# Patient Record
Sex: Female | Born: 1955 | Race: White | Hispanic: No | Marital: Single | Smoking: Former smoker
Health system: Southern US, Community
[De-identification: ages and names within clinical notes are randomized; demographics above are authoritative.]

## PROBLEM LIST (undated history)

## (undated) HISTORY — PX: AUGMENTATION MAMMAPLASTY: SUR837

---

## 1999-12-27 ENCOUNTER — Other Ambulatory Visit: Admission: RE | Admit: 1999-12-27 | Discharge: 1999-12-27 | Payer: Self-pay | Admitting: *Deleted

## 1999-12-28 ENCOUNTER — Encounter: Payer: Self-pay | Admitting: *Deleted

## 1999-12-28 ENCOUNTER — Encounter: Admission: RE | Admit: 1999-12-28 | Discharge: 1999-12-28 | Payer: Self-pay | Admitting: *Deleted

## 2000-07-13 ENCOUNTER — Encounter: Payer: Self-pay | Admitting: Family Medicine

## 2000-07-13 ENCOUNTER — Ambulatory Visit (HOSPITAL_COMMUNITY): Admission: RE | Admit: 2000-07-13 | Discharge: 2000-07-13 | Payer: Self-pay | Admitting: Family Medicine

## 2004-04-23 ENCOUNTER — Inpatient Hospital Stay (HOSPITAL_COMMUNITY): Admission: EM | Admit: 2004-04-23 | Discharge: 2004-04-25 | Payer: Self-pay | Admitting: Emergency Medicine

## 2005-04-21 ENCOUNTER — Emergency Department (HOSPITAL_COMMUNITY): Admission: EM | Admit: 2005-04-21 | Discharge: 2005-04-21 | Payer: Self-pay | Admitting: Emergency Medicine

## 2006-04-19 ENCOUNTER — Ambulatory Visit (HOSPITAL_COMMUNITY): Admission: RE | Admit: 2006-04-19 | Discharge: 2006-04-19 | Payer: Self-pay | Admitting: Family Medicine

## 2009-01-26 ENCOUNTER — Emergency Department (HOSPITAL_COMMUNITY): Admission: EM | Admit: 2009-01-26 | Discharge: 2009-01-26 | Payer: Self-pay | Admitting: Emergency Medicine

## 2011-01-28 NOTE — H&P (Signed)
NAME:  Katelyn Beck, Katelyn Beck                      ACCOUNT NO.:  [id number]   MEDICAL RECORD NO.:  [id number]                   PATIENT TYPE:  EMS   LOCATION:  MAJO                                 FACILITY:  [facility]   PHYSICIAN:  Isla Pence, M.D.             DATE OF BIRTH:  10/22/1955   DATE OF ADMISSION:  04/23/2004  DATE OF DISCHARGE:                                HISTORY & PHYSICAL   IDENTIFICATION:  This is a 55 year old white female whose primary care  physician is Dr. Elana Alm. Reade with [facility].   CHIEF COMPLAINT:  Weakness/fever/chills since Monday night.   HISTORY OF PRESENT ILLNESS:  This patient says that she stopped her Vicodin  and Percocet abuse on Sunday just cold [place].  After that, she started  developing fever, chills and weakness, and goose bumps.  The fever was not  documented, but she just felt hot to touch.  She had taken ibuprofen one  night and all other times had been taking Tylenol.  She would have episodes  of a fever and then have the sense of achiness.  She denies any IV drug use  or any other drugs, aside from the Vicodin and Percocet.  She would take  anywhere a total of 5-8 of either the Percocet or Vicodin combination and  has been doing so over the past 1-2 years.  The patient was concerned that  maybe she was experiencing withdrawal symptoms.  She was originally given  the Vicodin or Percocet for teeth pain, but since then has been buying it  off the street.  In addition, she has also noted some urinary symptoms  consisting of trouble starting her flow and stopping her flow over the past  month, however, she had frequency and dysuria over the past week.  She also  had nausea and vomiting which were mild, and with emesis x1.  No previous  history of UTI.  In the emergency room at [facility], she was noted to be  hypotensive with initial blood pressure of 88/48.  She was given a normal  saline fluid bolus and started on regular IV fluid.  She was  also noted to  have a UTI.  It was felt by the ER physician that patient had urosepsis and  needed to be admitted.  Negative for back pain.   ALLERGIES:  No known drug allergies.   CURRENT MEDICATIONS:  None.   PAST MEDICAL HISTORY:  History of migraine headaches over the past 3-1/2  years, but over the past 4 or 5 months, she has been having 1 migraine  headache a month, but not more frequently than that.  She has also not had  any menstruation for the past year, but has not sought medical attention for  this.  She had a bite to the left periorbital area about a month ago for  which she was treated as an outpatient.  Otherwise, she denies any  diabetes  mellitus, hypertension, hyperlipidemia, MI or any other underlying medical  problems.  She has not had a Pap for more than a year.  She thinks her last  Pap was done at [facility].   PAST SURGICAL HISTORY:  1. She is status post right salpingo-oophorectomy for a cyst at the age of     11.  2. She is also status post tonsillectomy.  3. No other surgery.   SOCIAL HISTORY:  She has been married since October of 2004 to the current  partner, but she has been together with this partner for the past 18 years.  She has got 2 biological kids from a previous relationship and 2 step-kids  with her current relationship.  Tobacco:  She smokes about a pack and a half  per day for the past 15-20 years.  She used to drink when she was growing up  in her high school days but none since then, used to drink during the  weekend during her high school days.   FAMILY HISTORY:  Family history is positive for hypertension in the brother,  stroke in the father at the age of 31, who died from complications of a  stroke with pneumonia.  There is no breast, colon, prostate or ovarian  cancer.  There is skin cancer in the grandfather.   REVIEW OF SYSTEMS:  Review of systems is as per HPI.  She otherwise denies  chronic cough except for q.a.m.,  denies hemoptysis, has noted weight loss of  about 20 pounds in the same time period that she has been abusing the  Vicodin and Percocet.  She otherwise denies heartburn, melena or  hematochezia, although she did report 1 episode of melena with these current  symptoms.  She otherwise denies any back pain.   PHYSICAL EXAM:  VITAL SIGNS:  As mentioned earlier, her initial blood  pressure was 88/48, pulse of 90, respiratory rate 18, saturations are 99% on  room air.  Repeat vitals done 23 minutes past midnight show a temperature of  100.6, blood pressure is 111/88, pulse of 90, respiratory rate 14.  She did  have orthostatic blood pressures done and that shows lying, her blood  pressure is 116/69 and standing is 111/88; pulse, there was no significant  difference, it went from 85 to 90, but this was after she had already  received IV fluids.  GENERAL:  In general, she does not appear to be in any distress.  She is  able to give me a good history.  She comes across as a little nervous.  HEENT:  HEENT shows dry buccal mucosa and tongue, otherwise, conjunctivae  are normal.  NECK:  There is no adenopathy.  LUNGS:  Her lungs are clear to auscultation bilaterally without any crackles  or wheezes.  HEART:  Heart is regular rate and rhythm.  ABDOMEN:  Bowel sounds are normal, soft, nontender.  No organomegaly.  BACK:  There is no CV angle tenderness, no spinous tenderness.  EXTREMITIES:  Lower extremities:  There is no pretibial edema.  NEUROLOGIC:  Aside from being a little jittery, there is no gross  neurological deficit.   LABORATORY WORKUP:  EKG shows normal sinus rhythm.   No radiological studies were done.   Her white count shows a WBC of 8700, hemoglobin and hematocrit of 12.6 and  36.2, platelet count 221,000; differential shows 82% neutrophils,  lymphocytes of 11.  Sodium is 135, potassium 3.1, chloride 105, CO2 23, glucose 103, BUN  and creatinine 11 and 1.1.  Her LFTs are entirely  normal  with the exception of albumin of 2.8.  Her urine drug screen was negative  except for benzodiazepine, although she did not tell me about this and she  states she was not on any medications. UA shows specific gravity of 1.013,  pH of 6, large hemoglobin, negative for protein, positive for nitrite and  leukocytes; urine microscopy shows 7-10 wbc's, rbc's of 11-20 and many  bacteria.   ASSESSMENT AND PLAN:  1. In regards to her fever, chills and generalized achiness, it could     certainly be part of a viral syndrome or actually could be part of     withdrawal symptoms from her narcotics, although she is really 5 days     post her last narcotic dosing, so it may not be related to that at all;     as to whether she might have an infectious process going on, certainly it     is a possibility.  There is no suggestion of pyelonephritis.  I am not     strongly convinced of urosepsis because her total white count is not     elevated, although she does have a left shift, although with her low     blood pressure, one could make a case for it, but I think some of it may     be related to just her being dehydrated.  We will go ahead and bring her     in, continue her with intravenous fluids.  We will make Ativan available     for her for her periods of anxiety.  I am not going to put her on     methadone, since she has actually been off narcotics for the past 5 days.     [facility] is already seeing her and will set her up for     outpatient rehabilitation program.  2. From an infectious disease standpoint, she certainly has a urinary tract     infection.  She does have a left shift on the white count, although total     white count is not elevated.  We will continue her on the Cipro; she had     received 1 dose of Cipro in the emergency room.  We will obtain urine     culture.  Certainly, it is possible that she could be having slight     urosepsis, although she is actually ambulating  now.  3. In regards to hypotension, this may be related just to being dehydrated.  4. In regards to menopausal symptoms, we will check an [facility] and [facility].  She will     need a Pap through her primary care physician.  5. In regards to her low potassium, she was given potassium in the emergency     room.  We will go ahead and give her another 40 mEq x1.  6. In regards to her slightly decreased albumin, it certainly reflects the     decreased appetite that she has had as an outpatient secondary to her     narcotic abuse and hopefully this will improve, once she starts improving     on her appetite again.  Isla Pence, M.D.    RRV/MEDQ  D:  04/23/2004  T:  04/23/2004  Job:  [id number]   cc:   Molly Maduro A. Nicholos Johns, M.D.  [address]  [place]  [place] [address]  Fax: [phone]

## 2011-01-28 NOTE — Discharge Summary (Signed)
NAME:  Katelyn Beck, Katelyn Beck                      ACCOUNT NO.:  [id number]   MEDICAL RECORD NO.:  [id number]                   PATIENT TYPE:  INP   LOCATION:  5524                                 FACILITY:  [facility]   PHYSICIAN:  Sherin Quarry, MD                   DATE OF BIRTH:  [birth year]   DATE OF ADMISSION:  04/22/2004  DATE OF DISCHARGE:  04/25/2004                                 DISCHARGE SUMMARY   HISTORY OF PRESENT ILLNESS:  Katelyn Beck is a 55 year old lady who  indicated on April 23, 2004, at the time of her admission, that she had a  longstanding history of abusing of Vicodin and Percocet.  About five days  prior to admission, she recognized that she was having a major problem with  narcotic abuse and decided to stop taking these medications.  After doing  so, she became somewhat tremulous, tachycardic, and diaphoretic.  She  attributed to these symptoms to narcotic withdrawal but then she developed  associated with chills.   At that point, she presented to the emergency room where evidence of urinary  tract infection was noted.  It was therefore felt prudent to admit her to  the hospital.  She was initially seen by Dr. Rita Ohara in the emergency  room.   PHYSICAL EXAMINATION:  HEENT:  Within normal limits.  CHEST:  Clear to auscultation and percussion.  CARDIOVASCULAR:  Normal S1and S2 without rubs, murmurs, or gallops.  ABDOMEN:  Benign.  There were no masses or tenderness.  NEUROLOGICAL:  Normal.  EXTREMITIES:  Normal.   LABORATORY DATA:  The initial white count was 8700, hemoglobin 12.6.  Sodium  was 135, potassium 3.1, creatinine was 1.1, BUN 11, albumin 2.8.  Urine drug  screen was positive for benzodiazepines.  Urinalysis was positive for  nitrite, leukocyte esterase, and hemoglobin.  There were many white cells  and bacteria seen.  Subsequently, a urine culture grew greater than 100,000  colonies of gram-negative rods that has not been further speciated  at this  point.  CBC revealed a white count of 8700, hemoglobin 12.6, hematocrit  36.2.  Liver profile was normal.   Chest x-ray showed evidence of bronchitis.  EKG was within normal limits.   HOSPITAL COURSE:  On the first day, the patient spiked a temperature to 103  degrees.  She was given normal saline at 200 per hour.  She was also placed  on Cipro 400 mg IV every 12 hours.  She felt considerably better over the  next 48 hours.  She was ambulatory.  She was able to eat.  She was not  experiencing any back pain or dysuria.  During the course of her  hospitalization, really the main complaint that she had was symptoms of acid  reflux.   By April 25, 2004, the patient seemed to be doing well and I felt it was  reasonable to discharge her.  At that time, I emphasized to her the  importance of compliance with her antibiotic regimen.  I advised her to  follow up with Dr. Elana Alm. Reade in seven to ten days and follow up with a  urinalysis.   DISCHARGE DIAGNOSES:  1. Urinary tract infection, probably pyelonephritis.  2. Dehydration.  3. Acid reflux.  4. Chronic abuse of narcotics.  5. Chronic migraine headaches.   DISCHARGE MEDICATIONS:  1. Cipro 500 mg b.i.d. for eight more days.  2. Protonix 40 mg q.d.   CONDITION ON DISCHARGE:  Good.                                                Sherin Quarry, MD    SY/MEDQ  D:  04/25/2004  T:  04/25/2004  Job:  [id number]   cc:   Molly Maduro A. Nicholos Johns, M.D.  [address]  [place]  [place] [address]  Fax: [phone]

## 2014-12-09 ENCOUNTER — Encounter (HOSPITAL_COMMUNITY): Payer: Self-pay | Admitting: *Deleted

## 2014-12-09 ENCOUNTER — Emergency Department (HOSPITAL_COMMUNITY): Payer: No Typology Code available for payment source

## 2014-12-09 ENCOUNTER — Other Ambulatory Visit: Payer: Self-pay

## 2014-12-09 ENCOUNTER — Emergency Department (HOSPITAL_COMMUNITY)
Admission: EM | Admit: 2014-12-09 | Discharge: 2014-12-09 | Disposition: A | Discharge status: Home / Self Care | Payer: No Typology Code available for payment source | Attending: Emergency Medicine | Admitting: Emergency Medicine

## 2014-12-09 DIAGNOSIS — Z3202 Encounter for pregnancy test, result negative: Secondary | ICD-10-CM | POA: Diagnosis not present

## 2014-12-09 DIAGNOSIS — Z79899 Other long term (current) drug therapy: Secondary | ICD-10-CM | POA: Diagnosis not present

## 2014-12-09 DIAGNOSIS — K279 Peptic ulcer, site unspecified, unspecified as acute or chronic, without hemorrhage or perforation: Secondary | ICD-10-CM | POA: Diagnosis not present

## 2014-12-09 DIAGNOSIS — Z87891 Personal history of nicotine dependence: Secondary | ICD-10-CM | POA: Diagnosis not present

## 2014-12-09 DIAGNOSIS — Z793 Long term (current) use of hormonal contraceptives: Secondary | ICD-10-CM | POA: Diagnosis not present

## 2014-12-09 DIAGNOSIS — R5383 Other fatigue: Secondary | ICD-10-CM | POA: Insufficient documentation

## 2014-12-09 DIAGNOSIS — K921 Melena: Secondary | ICD-10-CM | POA: Diagnosis present

## 2014-12-09 LAB — URINE MICROSCOPIC-ADD ON

## 2014-12-09 LAB — URINALYSIS, ROUTINE W REFLEX MICROSCOPIC
Bilirubin Urine: NEGATIVE
Glucose, UA: NEGATIVE mg/dL
Ketones, ur: NEGATIVE mg/dL
LEUKOCYTES UA: NEGATIVE
Nitrite: NEGATIVE
PH: 7 (ref 5.0–8.0)
Protein, ur: NEGATIVE mg/dL
Specific Gravity, Urine: 1.004 — ABNORMAL LOW (ref 1.005–1.030)
UROBILINOGEN UA: 0.2 mg/dL (ref 0.0–1.0)

## 2014-12-09 LAB — COMPREHENSIVE METABOLIC PANEL
ALT: 11 U/L (ref 0–35)
ANION GAP: 9 (ref 5–15)
AST: 18 U/L (ref 0–37)
Albumin: 4.6 g/dL (ref 3.5–5.2)
Alkaline Phosphatase: 62 U/L (ref 39–117)
BUN: 10 mg/dL (ref 6–23)
CO2: 26 mmol/L (ref 19–32)
Calcium: 9.1 mg/dL (ref 8.4–10.5)
Chloride: 107 mmol/L (ref 96–112)
Creatinine, Ser: 0.87 mg/dL (ref 0.50–1.10)
GFR calc Af Amer: 83 mL/min — ABNORMAL LOW (ref >90)
GFR calc non Af Amer: 72 mL/min — ABNORMAL LOW (ref >90)
GLUCOSE: 100 mg/dL — AB (ref 70–99)
Potassium: 3.3 mmol/L — ABNORMAL LOW (ref 3.5–5.1)
Sodium: 142 mmol/L (ref 135–145)
Total Bilirubin: 0.9 mg/dL (ref 0.3–1.2)
Total Protein: 7.6 g/dL (ref 6.0–8.3)

## 2014-12-09 LAB — CBC
HEMATOCRIT: 39.6 % (ref 36.0–46.0)
Hemoglobin: 13.5 g/dL (ref 12.0–15.0)
MCH: 29.1 pg (ref 26.0–34.0)
MCHC: 34.1 g/dL (ref 30.0–36.0)
MCV: 85.3 fL (ref 78.0–100.0)
PLATELETS: 354 10*3/uL (ref 150–400)
RBC: 4.64 MIL/uL (ref 3.87–5.11)
RDW: 14.3 % (ref 11.5–15.5)
WBC: 8.8 10*3/uL (ref 4.0–10.5)

## 2014-12-09 LAB — LIPASE, BLOOD: Lipase: 21 U/L (ref 11–59)

## 2014-12-09 LAB — POC OCCULT BLOOD, ED: FECAL OCCULT BLD: NEGATIVE

## 2014-12-09 LAB — POC URINE PREG, ED: Preg Test, Ur: NEGATIVE

## 2014-12-09 MED ORDER — ONDANSETRON HCL 4 MG/2ML IJ SOLN
4.0000 mg | Freq: Once | INTRAMUSCULAR | Status: AC
  Administered 2014-12-09: 4 mg via INTRAVENOUS
  Filled 2014-12-09: qty 2

## 2014-12-09 MED ORDER — FAMOTIDINE IN NACL 20-0.9 MG/50ML-% IV SOLN
20.0000 mg | INTRAVENOUS | Status: AC
  Administered 2014-12-09: 20 mg via INTRAVENOUS
  Filled 2014-12-09: qty 50

## 2014-12-09 MED ORDER — SODIUM CHLORIDE 0.9 % IV BOLUS (SEPSIS)
1000.0000 mL | Freq: Once | INTRAVENOUS | Status: AC
  Administered 2014-12-09: 1000 mL via INTRAVENOUS

## 2014-12-09 MED ORDER — FAMOTIDINE 20 MG PO TABS
20.0000 mg | ORAL_TABLET | Freq: Two times a day (BID) | ORAL | Status: AC
Start: 2014-12-09 — End: ?

## 2014-12-09 MED ORDER — SUCRALFATE 1 GM/10ML PO SUSP: 1.0000 g | Freq: Three times a day (TID) | ORAL | Status: AC

## 2014-12-09 NOTE — ED Notes (Signed)
EKG to [email or web address]

## 2014-12-09 NOTE — Discharge Instructions (Signed)
Peptic Ulcer A peptic ulcer is a sore in the lining of your esophagus (esophageal ulcer), stomach (gastric ulcer), or in the first part of your small intestine (duodenal ulcer). The ulcer causes erosion into the deeper tissue. CAUSES  Normally, the lining of the stomach and the small intestine protects itself from the acid that digests food. The protective lining can be damaged by:  An infection caused by a bacterium called Helicobacter pylori (H. pylori).  Regular use of nonsteroidal anti-inflammatory drugs (NSAIDs), such as ibuprofen or aspirin.  Smoking tobacco. Other risk factors include being older than 50, drinking alcohol excessively, and having a family history of ulcer disease.  SYMPTOMS   Burning pain or gnawing in the area between the chest and the belly button.  Heartburn.  Nausea and vomiting.  Bloating. The pain can be worse on an empty stomach and at night. If the ulcer results in bleeding, it can cause:  Black, tarry stools.  Vomiting of bright red blood.  Vomiting of coffee-ground-looking materials. DIAGNOSIS  A diagnosis is usually made based upon your history and an exam. Other tests and procedures may be performed to find the cause of the ulcer. Finding a cause will help determine the best treatment. Tests and procedures may include:  Blood tests, stool tests, or breath tests to check for the bacterium H. pylori.  An upper gastrointestinal (GI) series of the esophagus, stomach, and small intestine.  An endoscopy to examine the esophagus, stomach, and small intestine.  A biopsy. TREATMENT  Treatment may include:  Eliminating the cause of the ulcer, such as smoking, NSAIDs, or alcohol.  Medicines to reduce the amount of acid in your digestive tract.  Antibiotic medicines if the ulcer is caused by the H. pylori bacterium.  An upper endoscopy to treat a bleeding ulcer.  Surgery if the bleeding is severe or if the ulcer created a hole somewhere in the  digestive system. HOME CARE INSTRUCTIONS   Avoid tobacco, alcohol, and caffeine. Smoking can increase the acid in the stomach, and continued smoking will impair the healing of ulcers.  Avoid foods and drinks that seem to cause discomfort or aggravate your ulcer.  Only take medicines as directed by your caregiver. Do not substitute over-the-counter medicines for prescription medicines without talking to your caregiver.  Keep any follow-up appointments and tests as directed. SEEK MEDICAL CARE IF:   Your do not improve within 7 days of starting treatment.  You have ongoing indigestion or heartburn. SEEK IMMEDIATE MEDICAL CARE IF:   You have sudden, sharp, or persistent abdominal pain.  You have bloody or dark black, tarry stools.  You vomit blood or vomit that looks like coffee grounds.  You become light-headed, weak, or feel faint.  You become sweaty or clammy. MAKE SURE YOU:   Understand these instructions.  Will watch your condition.  Will get help right away if you are not doing well or get worse. Document Released: 08/26/2000 Document Revised: 01/13/2014 Document Reviewed: 03/28/2012 Exit[employer] Patient Information 2015 [employer]. This information is not intended to replace advice given to you by your health care provider. Make sure you discuss any questions you have with your health care provider.  

## 2014-12-09 NOTE — ED Notes (Signed)
Pt reports feeling fatigued for last week, since Saturday stool has been black. Reports abd pain x10 days. Pain 3/10.

## 2014-12-09 NOTE — ED Provider Notes (Signed)
The patient is a 59 year old female, she presents with approximately 1-2 weeks of epigastric abdominal pain. This seems to get worse at night and in the morning, worse when she does not eat, better with eating, not associated with nausea vomiting. She has been using lots of Pepto-Bismol and Tums which gives her temporary relief but has also caused dark and tarry appearing stools. She denies any prior abdominal surgery, fevers, chills or any other complaints.  On exam the patient has clear heart and lung sounds, moist mucous membranes, no peripheral edema, she is well-appearing without distress. Her abdomen is diffusely nontender, she has focal tenderness in the epigastrium only, no peritoneal signs, no Murphy sign, no pain at McBurney's point. Labs reviewed showing no signs of leukocytosis, no elevation of liver function, normal lipase. I suspect that her symptoms are from gastritis or peptic ulcer disease, she has a colonoscopy that she is either going to schedule auras coming out, the patient is unsure, she will follow up on this. I have recommended that she obtain an endoscopy as well and in the meantime we will start antacid therapy with prescription medications. The patient is in agreement with this plan. She will get hydration prior to discharge.  ED ECG REPORT  I personally interpreted this EKG   Date: 12/09/2014   Rate: 54  Rhythm: sinus bradycardia  QRS Axis: normal  Intervals: normal  ST/T Wave abnormalities: normal  Conduction Disutrbances:none  Narrative Interpretation:   Old EKG Reviewed: none available   Medical screening examination/treatment/procedure(s) were conducted as a shared visit with non-physician practitioner(s) and myself.  I personally evaluated the patient during the encounter.  Clinical Impression:   Final diagnoses:  Fatigue  Peptic ulcer      Eber HongBrian Kristina Bertone, MD 12/09/14 [phone]

## 2014-12-09 NOTE — ED Provider Notes (Signed)
CSN: [id number]     Arrival date & time 12/09/14  1322 History   First MD Initiated Contact with Patient 12/09/14 1528     Chief Complaint  Patient presents with  . black tarry stool      (Consider location/radiation/quality/duration/timing/severity/associated sxs/prior Treatment) HPI    PCP: NEAL,W RONALD, MD Blood pressure 147/95, pulse 91, temperature 97.7 F (36.5 C), temperature source Oral, resp. rate 18, SpO2 98 %.  Katelyn Beck is a 59 y.o.female without any significant PMH presents to the ER with complaints of  feeling fatigued for the past week and a half with nausea. She reports the symptoms started with some mild generalized weakness progressed to nausea. She did not have any episodes of vomiting, cough, fever or diarrhea. She felt so bad on Easter that she did not go and visit her daughter. She says this is very unlike her. She admits to decreased by mouth intake. She has been "drinking Pepto-Bismol by the bottle" which has been helping with her nausea. She reports having black tarry stools but has not noticed any red fluid on the tissue paper or in the toilet bowl. She denies having severe abdominal pain but reports more of a crampy diffuse discomfort.  Negative Review of Symptoms: Vomiting, diarrhea, fevers, chest pain, neck pain, lower extremity swelling, abdominal distention, back pain, weight loss.   History reviewed. No pertinent past medical history. History reviewed. No pertinent past surgical history. History reviewed. No pertinent family history. History  Substance Use Topics  . Smoking status: Former Smoker -- 1.00 packs/day for 40 years    Types: Cigarettes  . Smokeless tobacco: Not on file  . Alcohol Use: No   OB History    No data available     Review of Systems  10 Systems reviewed and are negative for acute change except as noted in the HPI.   Allergies  Benadryl  Home Medications   Prior to Admission medications   Medication Sig Start  Date End Date Taking? Authorizing Provider  acetaminophen (TYLENOL) 500 MG tablet Take 1,000 mg by mouth every 6 (six) hours as needed for mild pain or moderate pain.   Yes Historical Provider, MD  ALPRAZolam Prudy Feeler) 0.5 MG tablet Take 1 tablet by mouth 3 (three) times daily as needed. 12/01/14  Yes Historical Provider, MD  estradiol (VIVELLE-DOT) 0.075 MG/24HR Place 1 patch onto the skin 2 (two) times a week.   Yes Historical Provider, MD  progesterone (PROMETRIUM) 100 MG capsule Take 100 mg by mouth daily. Take 1 tablet by mouth for the first 10 days of each month. Take 20 days off before repeating cycle.   Yes Historical Provider, MD  famotidine (PEPCID) 20 MG tablet Take 1 tablet (20 mg total) by mouth 2 (two) times daily. 12/09/14   Lakeem Rozo Neva Seat, PA-C  sucralfate (CARAFATE) 1 GM/10ML suspension Take 10 mLs (1 g total) by mouth 4 (four) times daily -  with meals and at bedtime. 12/09/14   Jourdon Zimmerle Neva Seat, PA-C   BP 147/95 mmHg  Pulse 91  Temp(Src) 97.7 F (36.5 C) (Oral)  Resp 18  SpO2 98% Physical Exam  Constitutional: She appears well-developed and well-nourished. No distress.  HENT:  Head: Normocephalic and atraumatic.  Right Ear: External ear normal.  Left Ear: External ear normal.  Eyes: Pupils are equal, round, and reactive to light.  Neck: Normal range of motion. Neck supple. No spinous process tenderness and no muscular tenderness present.  Cardiovascular: Normal rate and regular rhythm.  Pulmonary/Chest: Effort normal. No respiratory distress. She has no wheezes.  Abdominal: Soft. She exhibits no distension. There is no tenderness. There is no rebound and no guarding.  Musculoskeletal:  No lower extremity swelling    Neurological: She is alert.  Pt alert and oriented x 3 Upper and lower extremity strength is symmetrical and physiologic Normal muscular tone No facial droop Coordination intact  Skin: Skin is warm and dry. She is not diaphoretic.  Nursing note and vitals  reviewed.  ED Course  Procedures (including critical care time) Labs Review Labs Reviewed  COMPREHENSIVE METABOLIC PANEL - Abnormal; Notable for the following:    Potassium 3.3 (*)    Glucose, Bld 100 (*)    GFR calc non Af Amer 72 (*)    GFR calc Af Amer 83 (*)    All other components within normal limits  URINALYSIS, ROUTINE W REFLEX MICROSCOPIC - Abnormal; Notable for the following:    Specific Gravity, Urine 1.004 (*)    Hgb urine dipstick MODERATE (*)    All other components within normal limits  CBC  LIPASE, BLOOD  URINE MICROSCOPIC-ADD ON  POC OCCULT BLOOD, ED  POC URINE PREG, ED    Imaging Review Dg Chest 2 View  12/09/2014   CLINICAL DATA:  Shortness of breath, fatigue for 1-2 weeks.  EXAM: CHEST  2 VIEW  COMPARISON:  Chest x-ray 04/23/2004  FINDINGS: There is hyperinflation of the lungs compatible with COPD. Heart and mediastinal contours are within normal limits. No focal opacities or effusions. No acute bony abnormality.  IMPRESSION: COPD.  No active disease.   Electronically Signed   By: Charlett NoseKevin  Dover M.D.   On: 12/09/2014 16:57     EKG Interpretation None      MDM   Final diagnoses:  Fatigue  Peptic ulcer    Medications  ondansetron (ZOFRAN) injection 4 mg (4 mg Intravenous Given 12/09/14 1638)  sodium chloride 0.9 % bolus 1,000 mL (1,000 mLs Intravenous New Bag/Given 12/09/14 1638)  famotidine (PEPCID) IVPB 20 mg (0 mg Intravenous Stopped 12/09/14 1707)  sodium chloride 0.9 % bolus 1,000 mL (1,000 mLs Intravenous New Bag/Given 12/09/14 1708)   The patient has had a negative urine pregnancy test as well as a negative fecal occult. She has been taking a large amount of Pepto-Bismol which would explain her dark tarry stool. Her CMP does not show any acute abnormalities aside from some mild dehydration. Her CBC is within normal limits, negative lipase.  On urinalysis she has moderate hemoglobin, she reports seeing a urologist for this and having a scan done. She  did not get a cystoscopy as recommended by urology at the time because she denies insurance. She has not noticed any gross blood in her urine. No suprapubic pain.   The patient has received 2 L of fluids here in the ED and reports feeling much better with that and the pepcid. Dr. Hyacinth MeekerMiller has seen patient as well and feels her symptoms are likely related to peptic ulcer. She has a Colonoscopy appt coming up and we recommend she get an endoscopy as well.  Started on pepcid and carafate- pt to also to f/u with PCP.  59 y.o.Katelyn Beck's evaluation in the Emergency Department is complete. It has been determined that no acute conditions requiring further emergency intervention are present at this time. The patient/guardian have been advised of the diagnosis and plan. We have discussed signs and symptoms that warrant return to the ED, such as changes or  worsening in symptoms.  Vital signs are stable at discharge. Filed Vitals:   12/09/14 1337  BP: 147/95  Pulse: 91  Temp: 97.7 F (36.5 C)  Resp: 18    Patient/guardian has voiced understanding and agreed to follow-up with the PCP or specialist.   Marlon Pel, PA-C 12/09/14 1902  Eber Hong, MD 12/09/14 [phone]

## 2015-12-24 ENCOUNTER — Encounter: Payer: Self-pay | Admitting: Obstetrics & Gynecology

## 2016-01-06 ENCOUNTER — Encounter: Payer: No Typology Code available for payment source | Admitting: Gastroenterology

## 2019-05-15 ENCOUNTER — Other Ambulatory Visit: Payer: Self-pay | Admitting: Obstetrics & Gynecology

## 2019-05-15 DIAGNOSIS — R928 Other abnormal and inconclusive findings on diagnostic imaging of breast: Secondary | ICD-10-CM

## 2019-05-22 ENCOUNTER — Other Ambulatory Visit: Payer: Self-pay | Admitting: Obstetrics & Gynecology

## 2019-05-22 ENCOUNTER — Ambulatory Visit
Admission: RE | Admit: 2019-05-22 | Discharge: 2019-05-22 | Disposition: A | Discharge status: Home / Self Care | Payer: BC Managed Care – PPO | Source: Ambulatory Visit | Attending: Obstetrics & Gynecology | Admitting: Obstetrics & Gynecology

## 2019-05-22 ENCOUNTER — Other Ambulatory Visit: Payer: Self-pay

## 2019-05-22 ENCOUNTER — Ambulatory Visit
Admission: RE | Admit: 2019-05-22 | Discharge: 2019-05-22 | Disposition: A | Discharge status: Home / Self Care | Payer: No Typology Code available for payment source | Source: Ambulatory Visit | Attending: Obstetrics & Gynecology | Admitting: Obstetrics & Gynecology

## 2019-05-22 DIAGNOSIS — R928 Other abnormal and inconclusive findings on diagnostic imaging of breast: Secondary | ICD-10-CM

## 2019-05-27 ENCOUNTER — Other Ambulatory Visit: Payer: No Typology Code available for payment source

## 2019-12-05 ENCOUNTER — Ambulatory Visit: Payer: No Typology Code available for payment source | Attending: Internal Medicine

## 2019-12-05 DIAGNOSIS — Z23 Encounter for immunization: Secondary | ICD-10-CM

## 2019-12-05 NOTE — Progress Notes (Signed)
   Covid-19 Vaccination Clinic  Name:  Katelyn Beck    MRN: [id number] DOB: June 12, 1956  12/05/2019  [name] was observed post Covid-19 immunization for 15 minutes without incident. She was provided with Vaccine Information Sheet and instruction to access the V-Safe system.   [name] was instructed to call 911 with any severe reactions post vaccine: Marland Kitchen Difficulty breathing  . Swelling of face and throat  . A fast heartbeat  . A bad rash all over body  . Dizziness and weakness   Immunizations Administered    Name Date Dose VIS Date Route   Pfizer COVID-19 Vaccine 12/05/2019  4:15 PM 0.3 mL 08/23/2019 Intramuscular   Manufacturer: [employer], [employer]   Lot: [id number]   NDC: [address]-8

## 2019-12-30 ENCOUNTER — Ambulatory Visit: Payer: No Typology Code available for payment source | Attending: Internal Medicine

## 2019-12-30 DIAGNOSIS — Z23 Encounter for immunization: Secondary | ICD-10-CM

## 2019-12-30 NOTE — Progress Notes (Signed)
   Covid-19 Vaccination Clinic  Name:  VEEDA VIRGO    MRN: [id number] DOB: Mar 30, 1956  12/30/2019  Ms. Sebek was observed post Covid-19 immunization for 15 minutes without incident. She was provided with Vaccine Information Sheet and instruction to access the V-Safe system.   Ms. Buechler was instructed to call 911 with any severe reactions post vaccine: Marland Kitchen Difficulty breathing  . Swelling of face and throat  . A fast heartbeat  . A bad rash all over body  . Dizziness and weakness   Immunizations Administered    Name Date Dose VIS Date Route   Pfizer COVID-19 Vaccine 12/30/2019  2:18 PM 0.3 mL 11/06/2018 Intramuscular   Manufacturer: [employer], [employer]   Lot: [id number]   NDC: [address]-9

## 2020-04-04 IMAGING — MG MM DIGITAL DIAGNOSTIC UNILAT*R* W/ TOMO W/ CAD
8 series · 9 of 24 positions shown · non-contrast
Comparison: Previous exam(s).

CLINICAL DATA: Possible small mass in the medial right breast and
possible small vas in the outer right breast in the craniocaudal
projection with a possible small mass in the superior aspect of the
right breast in the oblique projection on a recent screening
mammogram.

EXAM:
DIGITAL DIAGNOSTIC RIGHT MAMMOGRAM WITH TOMO
ULTRASOUND RIGHT BREAST

[R ML synth-2D]
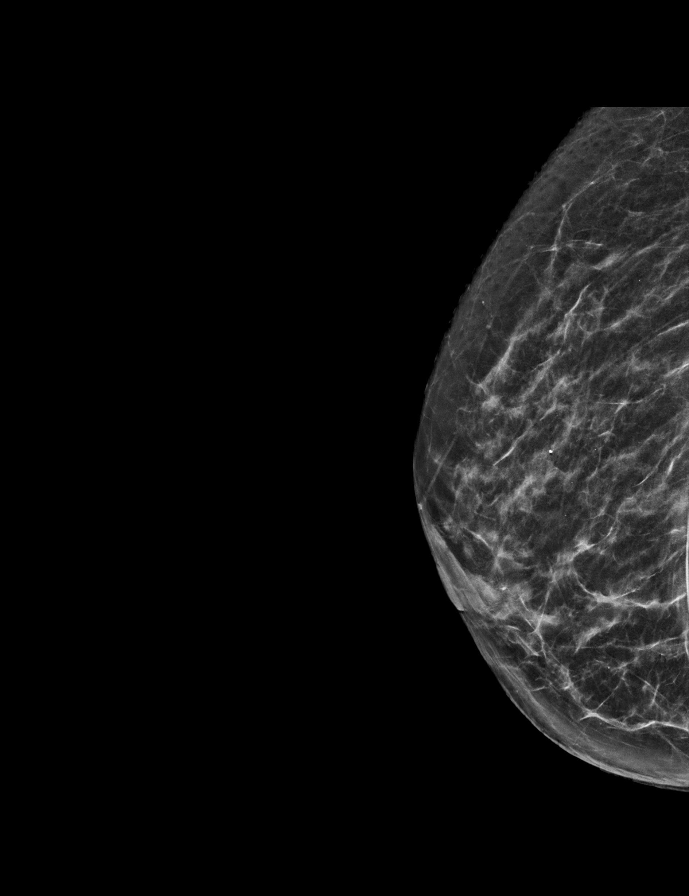

[R CC synth-2D (1 of 2)]
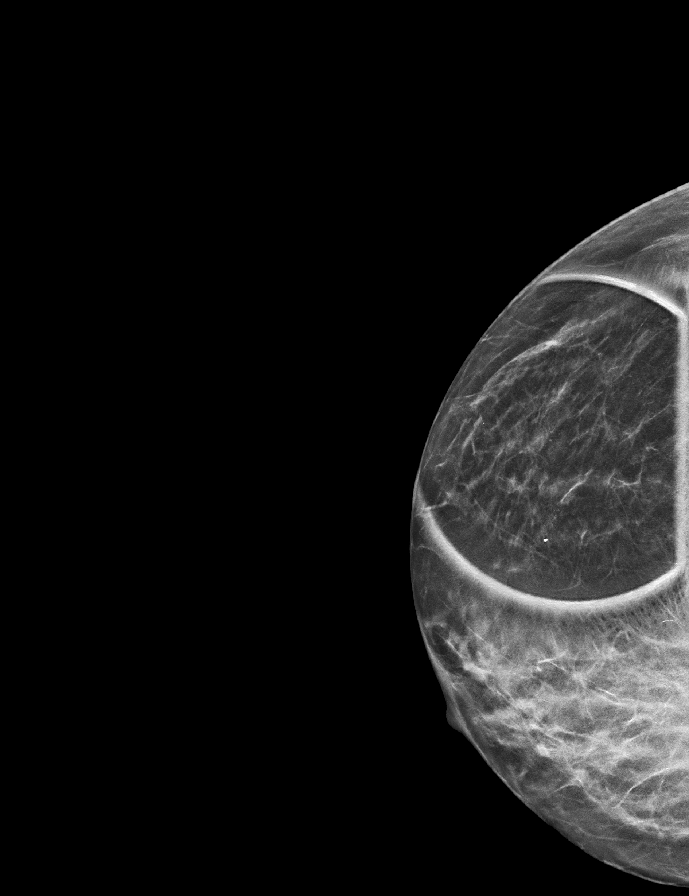

[R CC synth-2D (2 of 2)]
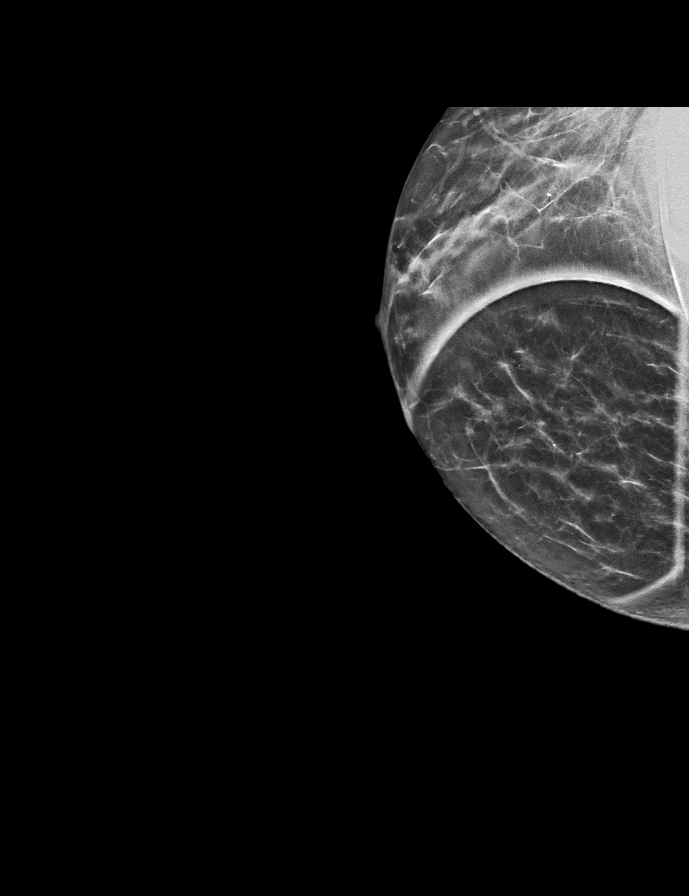

[R MLO synth-2D]
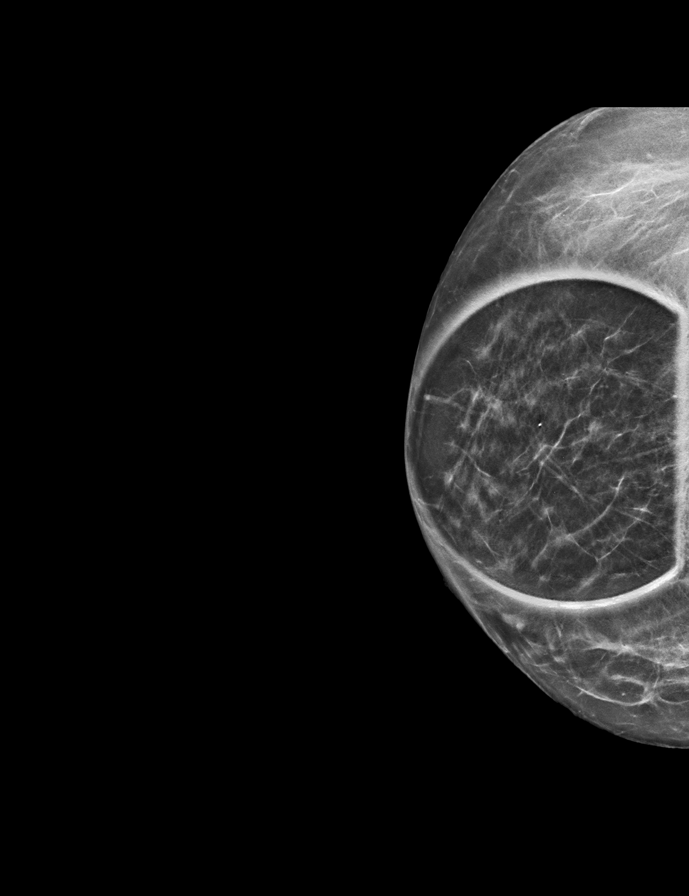

[R CC tomo · 2 of 40 frames shown (1 of 2)]
[frame 13/40]
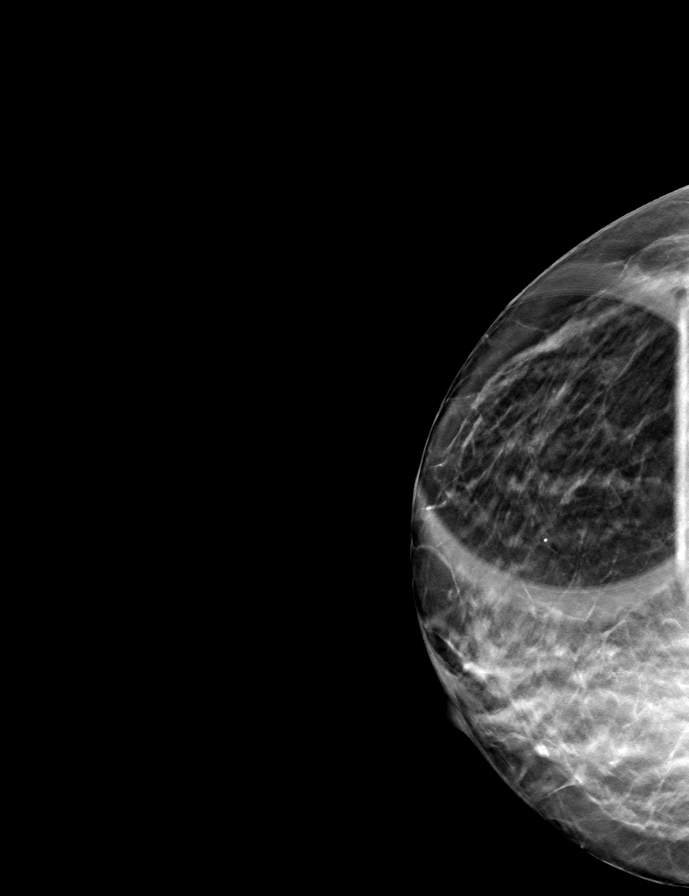
[frame 21/40]
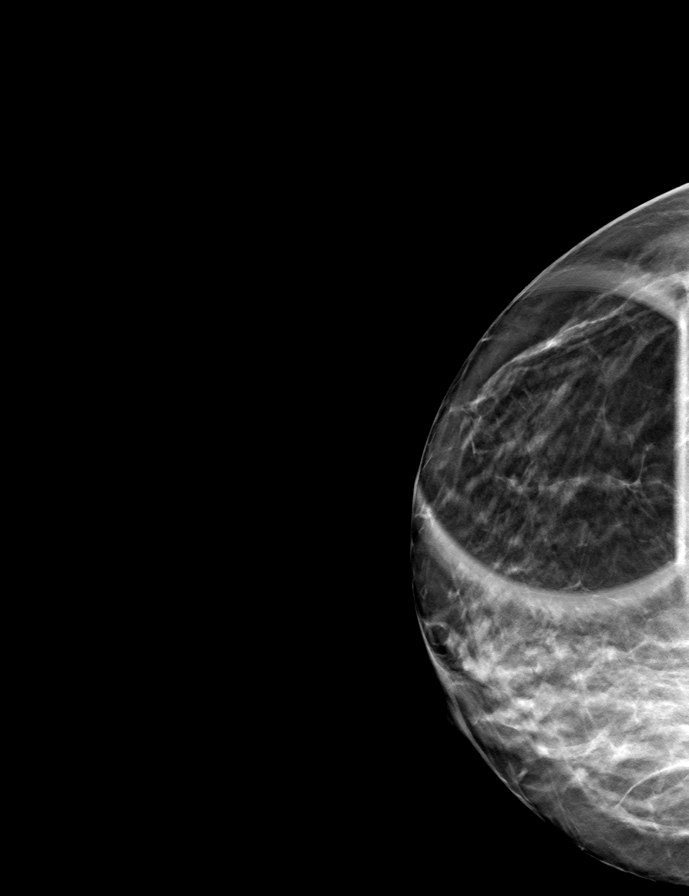

[R CC tomo (2 of 2) · tomo slice 23/45.0]
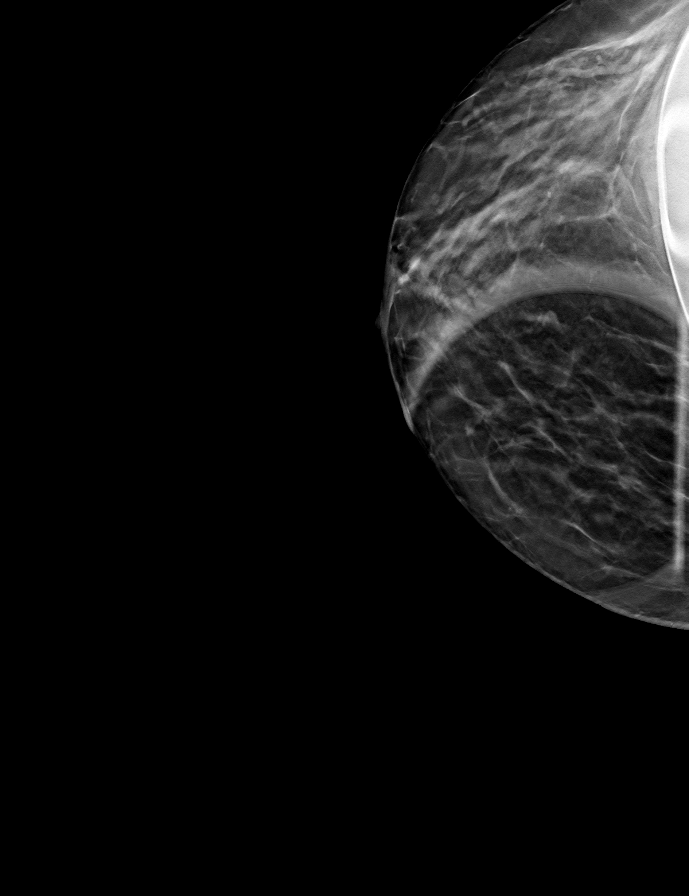

[R ML tomo · tomo slice 29/57.0]
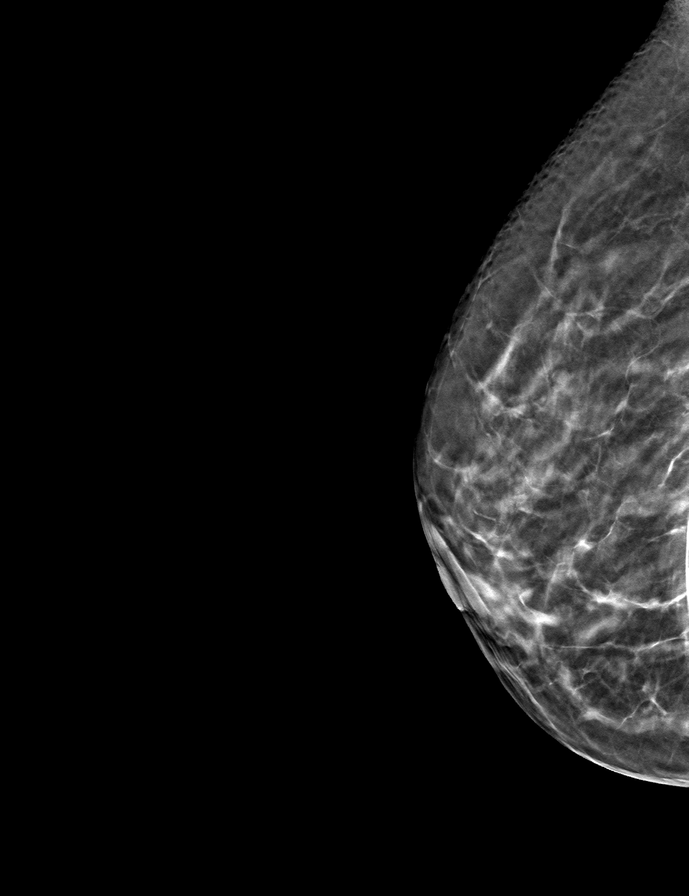

[R MLO tomo · tomo slice 22/43.0]
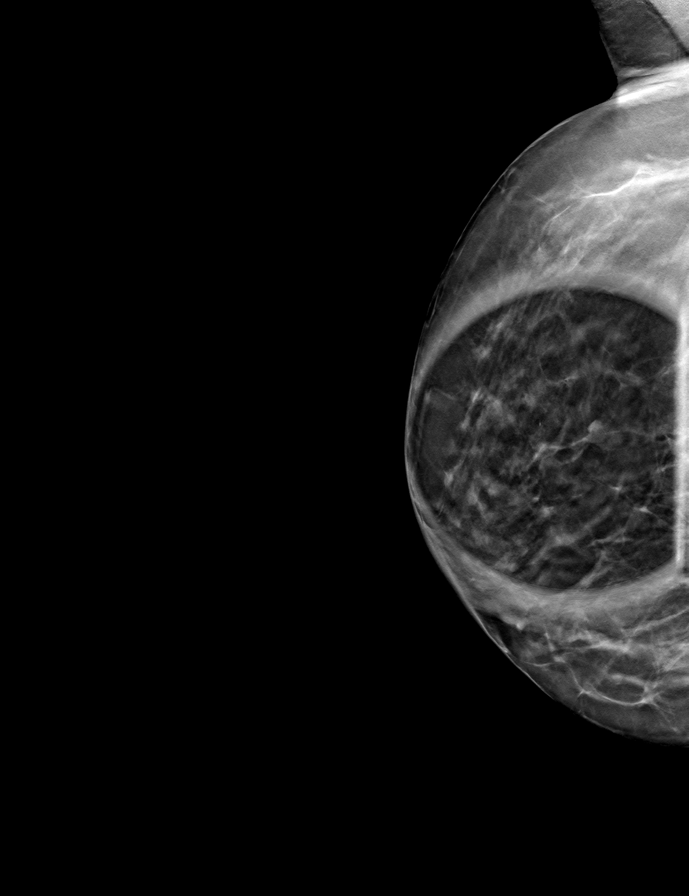

[9 of 24 positions shown; findings below may reference images not displayed]

ACR Breast Density Category b: There are scattered areas of
fibroglandular density.
FINDINGS: There are questionable 3 mm partially circumscribed and partially
obscured masses in the right breast laterally, medially and
centrally.

On physical exam, no mass is palpable in the right breast.

Targeted ultrasound is performed, showing multiple subcentimeter
cystic areas and dilated ducts in the right breast. No solid masses
or intraductal masses were seen.
IMPRESSION: Benign right breast cysts and duct ectasia. No evidence of
malignancy.

RECOMMENDATION:
Bilateral screening mammogram in 1 year.

I have discussed the findings and recommendations with the patient.
If applicable, a reminder letter will be sent to the patient
regarding the next appointment.

BI-RADS CATEGORY  2: Benign.

## 2023-02-28 ENCOUNTER — Other Ambulatory Visit: Payer: Self-pay | Admitting: Obstetrics and Gynecology

## 2023-02-28 DIAGNOSIS — R928 Other abnormal and inconclusive findings on diagnostic imaging of breast: Secondary | ICD-10-CM

## 2023-03-07 ENCOUNTER — Ambulatory Visit
Admission: RE | Admit: 2023-03-07 | Discharge: 2023-03-07 | Disposition: A | Discharge status: Home / Self Care | Payer: Medicare Other | Source: Ambulatory Visit | Attending: Obstetrics and Gynecology | Admitting: Obstetrics and Gynecology

## 2023-03-07 DIAGNOSIS — R928 Other abnormal and inconclusive findings on diagnostic imaging of breast: Secondary | ICD-10-CM
